# Patient Record
Sex: Male | Born: 1955 | Race: White | Hispanic: No | Marital: Married
Health system: Southern US, Community
[De-identification: ages and names within clinical notes are randomized; demographics above are authoritative.]

## PROBLEM LIST (undated history)

## (undated) ENCOUNTER — Emergency Department (HOSPITAL_COMMUNITY): Payer: Medicare Other

## (undated) DIAGNOSIS — E291 Testicular hypofunction: Secondary | ICD-10-CM

## (undated) DIAGNOSIS — G8929 Other chronic pain: Secondary | ICD-10-CM

## (undated) DIAGNOSIS — I10 Essential (primary) hypertension: Secondary | ICD-10-CM

## (undated) DIAGNOSIS — K219 Gastro-esophageal reflux disease without esophagitis: Secondary | ICD-10-CM

## (undated) DIAGNOSIS — N400 Enlarged prostate without lower urinary tract symptoms: Secondary | ICD-10-CM

## (undated) DIAGNOSIS — M419 Scoliosis, unspecified: Secondary | ICD-10-CM

## (undated) DIAGNOSIS — J309 Allergic rhinitis, unspecified: Secondary | ICD-10-CM

## (undated) DIAGNOSIS — C61 Malignant neoplasm of prostate: Secondary | ICD-10-CM

## (undated) DIAGNOSIS — G4733 Obstructive sleep apnea (adult) (pediatric): Secondary | ICD-10-CM

## (undated) HISTORY — DX: Benign prostatic hyperplasia without lower urinary tract symptoms: N40.0

## (undated) HISTORY — DX: Malignant neoplasm of prostate: C61

## (undated) HISTORY — PX: CATARACT EXTRACTION: SUR2

## (undated) HISTORY — DX: Gastro-esophageal reflux disease without esophagitis: K21.9

## (undated) HISTORY — DX: Testicular hypofunction: E29.1

## (undated) HISTORY — DX: Obstructive sleep apnea (adult) (pediatric): G47.33

## (undated) HISTORY — PX: OTHER SURGICAL HISTORY: SHX169

## (undated) HISTORY — DX: Essential (primary) hypertension: I10

## (undated) HISTORY — DX: Other chronic pain: G89.29

## (undated) HISTORY — DX: Allergic rhinitis, unspecified: J30.9

## (undated) HISTORY — PX: EYE SURGERY: SHX253

## (undated) HISTORY — PX: TONSILLECTOMY: SUR1361

## (undated) HISTORY — PX: ROTATOR CUFF REPAIR: SHX139

## (undated) HISTORY — PX: HERNIA REPAIR: SHX51

## (undated) HISTORY — PX: FOREARM SURGERY: SHX651

---

## 2020-10-15 ENCOUNTER — Other Ambulatory Visit: Payer: Self-pay | Admitting: Internal Medicine

## 2020-10-15 ENCOUNTER — Ambulatory Visit
Admission: RE | Admit: 2020-10-15 | Discharge: 2020-10-15 | Disposition: A | Discharge status: Home / Self Care | Payer: Medicare Other | Source: Ambulatory Visit | Attending: Internal Medicine | Admitting: Internal Medicine

## 2020-10-15 DIAGNOSIS — R109 Unspecified abdominal pain: Secondary | ICD-10-CM

## 2020-10-15 MED ORDER — IOPAMIDOL (ISOVUE-300) INJECTION 61%
100.0000 mL | Freq: Once | INTRAVENOUS | Status: AC | PRN
  Administered 2020-10-15: 100 mL via INTRAVENOUS

## 2021-07-25 DIAGNOSIS — G4730 Sleep apnea, unspecified: Secondary | ICD-10-CM | POA: Diagnosis not present

## 2021-07-25 DIAGNOSIS — M47817 Spondylosis without myelopathy or radiculopathy, lumbosacral region: Secondary | ICD-10-CM | POA: Diagnosis not present

## 2021-07-25 DIAGNOSIS — G4700 Insomnia, unspecified: Secondary | ICD-10-CM | POA: Diagnosis not present

## 2021-07-25 DIAGNOSIS — G8929 Other chronic pain: Secondary | ICD-10-CM | POA: Diagnosis not present

## 2021-08-15 DIAGNOSIS — H35033 Hypertensive retinopathy, bilateral: Secondary | ICD-10-CM | POA: Diagnosis not present

## 2021-08-15 DIAGNOSIS — H53461 Homonymous bilateral field defects, right side: Secondary | ICD-10-CM | POA: Diagnosis not present

## 2021-08-15 DIAGNOSIS — H2513 Age-related nuclear cataract, bilateral: Secondary | ICD-10-CM | POA: Diagnosis not present

## 2021-10-12 ENCOUNTER — Other Ambulatory Visit (HOSPITAL_COMMUNITY): Payer: Self-pay | Admitting: Internal Medicine

## 2021-10-12 ENCOUNTER — Other Ambulatory Visit: Payer: Self-pay | Admitting: Internal Medicine

## 2021-10-12 ENCOUNTER — Ambulatory Visit (HOSPITAL_COMMUNITY)
Admission: RE | Admit: 2021-10-12 | Discharge: 2021-10-12 | Disposition: A | Discharge status: Home / Self Care | Payer: Medicare Other | Source: Ambulatory Visit | Attending: Internal Medicine | Admitting: Internal Medicine

## 2021-10-12 DIAGNOSIS — H53469 Homonymous bilateral field defects, unspecified side: Secondary | ICD-10-CM | POA: Insufficient documentation

## 2021-10-12 DIAGNOSIS — E291 Testicular hypofunction: Secondary | ICD-10-CM | POA: Diagnosis not present

## 2021-10-12 DIAGNOSIS — I10 Essential (primary) hypertension: Secondary | ICD-10-CM | POA: Diagnosis not present

## 2021-10-12 DIAGNOSIS — E785 Hyperlipidemia, unspecified: Secondary | ICD-10-CM | POA: Diagnosis not present

## 2021-10-12 DIAGNOSIS — Z125 Encounter for screening for malignant neoplasm of prostate: Secondary | ICD-10-CM | POA: Diagnosis not present

## 2021-10-12 MED ORDER — GADOBUTROL 1 MMOL/ML IV SOLN
9.0000 mL | Freq: Once | INTRAVENOUS | Status: AC | PRN
  Administered 2021-10-12: 9 mL via INTRAVENOUS

## 2021-10-18 DIAGNOSIS — Z0000 Encounter for general adult medical examination without abnormal findings: Secondary | ICD-10-CM | POA: Diagnosis not present

## 2021-10-18 DIAGNOSIS — H53469 Homonymous bilateral field defects, unspecified side: Secondary | ICD-10-CM | POA: Diagnosis not present

## 2021-10-18 DIAGNOSIS — E785 Hyperlipidemia, unspecified: Secondary | ICD-10-CM | POA: Diagnosis not present

## 2021-10-18 DIAGNOSIS — I700 Atherosclerosis of aorta: Secondary | ICD-10-CM | POA: Diagnosis not present

## 2022-02-15 DIAGNOSIS — M25511 Pain in right shoulder: Secondary | ICD-10-CM | POA: Diagnosis not present

## 2022-02-15 DIAGNOSIS — G894 Chronic pain syndrome: Secondary | ICD-10-CM | POA: Diagnosis not present

## 2022-02-17 DIAGNOSIS — Z79899 Other long term (current) drug therapy: Secondary | ICD-10-CM | POA: Diagnosis not present

## 2022-02-17 DIAGNOSIS — G894 Chronic pain syndrome: Secondary | ICD-10-CM | POA: Diagnosis not present

## 2022-02-17 DIAGNOSIS — M25511 Pain in right shoulder: Secondary | ICD-10-CM | POA: Diagnosis not present

## 2022-02-17 DIAGNOSIS — Z5181 Encounter for therapeutic drug level monitoring: Secondary | ICD-10-CM | POA: Diagnosis not present

## 2022-02-17 DIAGNOSIS — M47817 Spondylosis without myelopathy or radiculopathy, lumbosacral region: Secondary | ICD-10-CM | POA: Diagnosis not present

## 2022-02-24 DIAGNOSIS — M25511 Pain in right shoulder: Secondary | ICD-10-CM | POA: Diagnosis not present

## 2022-03-02 DIAGNOSIS — M25511 Pain in right shoulder: Secondary | ICD-10-CM | POA: Diagnosis not present

## 2022-03-15 DIAGNOSIS — H903 Sensorineural hearing loss, bilateral: Secondary | ICD-10-CM | POA: Diagnosis not present

## 2022-03-21 DIAGNOSIS — M75101 Unspecified rotator cuff tear or rupture of right shoulder, not specified as traumatic: Secondary | ICD-10-CM | POA: Diagnosis not present

## 2022-04-04 DIAGNOSIS — I10 Essential (primary) hypertension: Secondary | ICD-10-CM | POA: Diagnosis not present

## 2022-04-04 DIAGNOSIS — F5104 Psychophysiologic insomnia: Secondary | ICD-10-CM | POA: Diagnosis not present

## 2022-04-04 DIAGNOSIS — I700 Atherosclerosis of aorta: Secondary | ICD-10-CM | POA: Diagnosis not present

## 2022-04-04 DIAGNOSIS — F339 Major depressive disorder, recurrent, unspecified: Secondary | ICD-10-CM | POA: Diagnosis not present

## 2022-04-10 DIAGNOSIS — M7541 Impingement syndrome of right shoulder: Secondary | ICD-10-CM | POA: Diagnosis not present

## 2022-04-10 DIAGNOSIS — M7521 Bicipital tendinitis, right shoulder: Secondary | ICD-10-CM | POA: Diagnosis not present

## 2022-04-10 DIAGNOSIS — M7551 Bursitis of right shoulder: Secondary | ICD-10-CM | POA: Diagnosis not present

## 2022-04-10 DIAGNOSIS — M75121 Complete rotator cuff tear or rupture of right shoulder, not specified as traumatic: Secondary | ICD-10-CM | POA: Diagnosis not present

## 2022-04-10 DIAGNOSIS — S4381XA Sprain of other specified parts of right shoulder girdle, initial encounter: Secondary | ICD-10-CM | POA: Diagnosis not present

## 2022-04-10 DIAGNOSIS — M75111 Incomplete rotator cuff tear or rupture of right shoulder, not specified as traumatic: Secondary | ICD-10-CM | POA: Diagnosis not present

## 2022-04-10 DIAGNOSIS — M948X1 Other specified disorders of cartilage, shoulder: Secondary | ICD-10-CM | POA: Diagnosis not present

## 2022-04-10 DIAGNOSIS — M24111 Other articular cartilage disorders, right shoulder: Secondary | ICD-10-CM | POA: Diagnosis not present

## 2022-04-18 DIAGNOSIS — M25511 Pain in right shoulder: Secondary | ICD-10-CM | POA: Diagnosis not present

## 2022-04-20 DIAGNOSIS — M47817 Spondylosis without myelopathy or radiculopathy, lumbosacral region: Secondary | ICD-10-CM | POA: Diagnosis not present

## 2022-04-20 DIAGNOSIS — M25511 Pain in right shoulder: Secondary | ICD-10-CM | POA: Diagnosis not present

## 2022-04-20 DIAGNOSIS — G894 Chronic pain syndrome: Secondary | ICD-10-CM | POA: Diagnosis not present

## 2022-04-20 DIAGNOSIS — Z79899 Other long term (current) drug therapy: Secondary | ICD-10-CM | POA: Diagnosis not present

## 2022-04-25 DIAGNOSIS — M25511 Pain in right shoulder: Secondary | ICD-10-CM | POA: Diagnosis not present

## 2022-04-27 DIAGNOSIS — M25511 Pain in right shoulder: Secondary | ICD-10-CM | POA: Diagnosis not present

## 2022-05-02 DIAGNOSIS — M25511 Pain in right shoulder: Secondary | ICD-10-CM | POA: Diagnosis not present

## 2022-05-03 DIAGNOSIS — N401 Enlarged prostate with lower urinary tract symptoms: Secondary | ICD-10-CM | POA: Diagnosis not present

## 2022-05-03 DIAGNOSIS — R948 Abnormal results of function studies of other organs and systems: Secondary | ICD-10-CM | POA: Diagnosis not present

## 2022-05-03 DIAGNOSIS — R351 Nocturia: Secondary | ICD-10-CM | POA: Diagnosis not present

## 2022-05-03 DIAGNOSIS — R3912 Poor urinary stream: Secondary | ICD-10-CM | POA: Diagnosis not present

## 2022-05-03 DIAGNOSIS — E291 Testicular hypofunction: Secondary | ICD-10-CM | POA: Diagnosis not present

## 2022-05-09 DIAGNOSIS — M25511 Pain in right shoulder: Secondary | ICD-10-CM | POA: Diagnosis not present

## 2022-05-12 DIAGNOSIS — M25511 Pain in right shoulder: Secondary | ICD-10-CM | POA: Diagnosis not present

## 2022-05-16 DIAGNOSIS — M25511 Pain in right shoulder: Secondary | ICD-10-CM | POA: Diagnosis not present

## 2022-05-18 DIAGNOSIS — M25511 Pain in right shoulder: Secondary | ICD-10-CM | POA: Diagnosis not present

## 2022-05-23 DIAGNOSIS — M25511 Pain in right shoulder: Secondary | ICD-10-CM | POA: Diagnosis not present

## 2022-05-30 DIAGNOSIS — M41122 Adolescent idiopathic scoliosis, cervical region: Secondary | ICD-10-CM | POA: Diagnosis not present

## 2022-05-30 DIAGNOSIS — H259 Unspecified age-related cataract: Secondary | ICD-10-CM | POA: Diagnosis not present

## 2022-05-30 DIAGNOSIS — H47011 Ischemic optic neuropathy, right eye: Secondary | ICD-10-CM | POA: Diagnosis not present

## 2022-05-30 DIAGNOSIS — H5330 Unspecified disorder of binocular vision: Secondary | ICD-10-CM | POA: Diagnosis not present

## 2022-06-01 DIAGNOSIS — F4310 Post-traumatic stress disorder, unspecified: Secondary | ICD-10-CM | POA: Diagnosis not present

## 2022-06-15 DIAGNOSIS — M25511 Pain in right shoulder: Secondary | ICD-10-CM | POA: Diagnosis not present

## 2022-06-15 DIAGNOSIS — F4310 Post-traumatic stress disorder, unspecified: Secondary | ICD-10-CM | POA: Diagnosis not present

## 2022-06-20 DIAGNOSIS — M25511 Pain in right shoulder: Secondary | ICD-10-CM | POA: Diagnosis not present

## 2022-06-22 DIAGNOSIS — M25511 Pain in right shoulder: Secondary | ICD-10-CM | POA: Diagnosis not present

## 2022-06-27 DIAGNOSIS — M25511 Pain in right shoulder: Secondary | ICD-10-CM | POA: Diagnosis not present

## 2022-06-29 DIAGNOSIS — M25511 Pain in right shoulder: Secondary | ICD-10-CM | POA: Diagnosis not present

## 2022-07-06 DIAGNOSIS — M25511 Pain in right shoulder: Secondary | ICD-10-CM | POA: Diagnosis not present

## 2022-07-13 DIAGNOSIS — M25511 Pain in right shoulder: Secondary | ICD-10-CM | POA: Diagnosis not present

## 2022-07-13 DIAGNOSIS — H25813 Combined forms of age-related cataract, bilateral: Secondary | ICD-10-CM | POA: Diagnosis not present

## 2022-07-13 DIAGNOSIS — H524 Presbyopia: Secondary | ICD-10-CM | POA: Diagnosis not present

## 2022-07-13 DIAGNOSIS — H5213 Myopia, bilateral: Secondary | ICD-10-CM | POA: Diagnosis not present

## 2022-07-20 DIAGNOSIS — M25511 Pain in right shoulder: Secondary | ICD-10-CM | POA: Diagnosis not present

## 2022-07-26 DIAGNOSIS — H25011 Cortical age-related cataract, right eye: Secondary | ICD-10-CM | POA: Diagnosis not present

## 2022-07-26 DIAGNOSIS — H25812 Combined forms of age-related cataract, left eye: Secondary | ICD-10-CM | POA: Diagnosis not present

## 2022-07-26 DIAGNOSIS — H2512 Age-related nuclear cataract, left eye: Secondary | ICD-10-CM | POA: Diagnosis not present

## 2022-07-26 DIAGNOSIS — H2511 Age-related nuclear cataract, right eye: Secondary | ICD-10-CM | POA: Diagnosis not present

## 2022-07-27 DIAGNOSIS — G894 Chronic pain syndrome: Secondary | ICD-10-CM | POA: Diagnosis not present

## 2022-07-27 DIAGNOSIS — M47817 Spondylosis without myelopathy or radiculopathy, lumbosacral region: Secondary | ICD-10-CM | POA: Diagnosis not present

## 2022-07-27 DIAGNOSIS — M25511 Pain in right shoulder: Secondary | ICD-10-CM | POA: Diagnosis not present

## 2022-07-27 DIAGNOSIS — Z79899 Other long term (current) drug therapy: Secondary | ICD-10-CM | POA: Diagnosis not present

## 2022-08-02 DIAGNOSIS — H2511 Age-related nuclear cataract, right eye: Secondary | ICD-10-CM | POA: Diagnosis not present

## 2022-08-02 DIAGNOSIS — H2181 Floppy iris syndrome: Secondary | ICD-10-CM | POA: Diagnosis not present

## 2022-08-08 DIAGNOSIS — M25511 Pain in right shoulder: Secondary | ICD-10-CM | POA: Diagnosis not present

## 2022-08-10 DIAGNOSIS — M25511 Pain in right shoulder: Secondary | ICD-10-CM | POA: Diagnosis not present

## 2022-08-15 DIAGNOSIS — Z9889 Other specified postprocedural states: Secondary | ICD-10-CM | POA: Diagnosis not present

## 2022-10-24 DIAGNOSIS — R948 Abnormal results of function studies of other organs and systems: Secondary | ICD-10-CM | POA: Diagnosis not present

## 2022-10-24 DIAGNOSIS — E291 Testicular hypofunction: Secondary | ICD-10-CM | POA: Diagnosis not present

## 2022-10-27 DIAGNOSIS — E291 Testicular hypofunction: Secondary | ICD-10-CM | POA: Diagnosis not present

## 2022-10-27 DIAGNOSIS — R972 Elevated prostate specific antigen [PSA]: Secondary | ICD-10-CM | POA: Diagnosis not present

## 2022-10-27 DIAGNOSIS — N401 Enlarged prostate with lower urinary tract symptoms: Secondary | ICD-10-CM | POA: Diagnosis not present

## 2022-10-27 DIAGNOSIS — R351 Nocturia: Secondary | ICD-10-CM | POA: Diagnosis not present

## 2022-11-09 DIAGNOSIS — M47817 Spondylosis without myelopathy or radiculopathy, lumbosacral region: Secondary | ICD-10-CM | POA: Diagnosis not present

## 2022-11-09 DIAGNOSIS — G894 Chronic pain syndrome: Secondary | ICD-10-CM | POA: Diagnosis not present

## 2022-11-09 DIAGNOSIS — Z79899 Other long term (current) drug therapy: Secondary | ICD-10-CM | POA: Diagnosis not present

## 2022-11-09 DIAGNOSIS — M25511 Pain in right shoulder: Secondary | ICD-10-CM | POA: Diagnosis not present

## 2022-11-22 DIAGNOSIS — K080 Exfoliation of teeth due to systemic causes: Secondary | ICD-10-CM | POA: Diagnosis not present

## 2022-12-29 DIAGNOSIS — M47817 Spondylosis without myelopathy or radiculopathy, lumbosacral region: Secondary | ICD-10-CM | POA: Diagnosis not present

## 2023-01-17 ENCOUNTER — Ambulatory Visit (HOSPITAL_COMMUNITY)
Admission: EM | Admit: 2023-01-17 | Discharge: 2023-01-17 | Disposition: A | Discharge status: Home / Self Care | Payer: Medicare Other | Attending: Family Medicine | Admitting: Family Medicine

## 2023-01-17 ENCOUNTER — Ambulatory Visit (INDEPENDENT_AMBULATORY_CARE_PROVIDER_SITE_OTHER): Payer: Medicare Other

## 2023-01-17 ENCOUNTER — Encounter (HOSPITAL_COMMUNITY): Payer: Self-pay | Admitting: Emergency Medicine

## 2023-01-17 DIAGNOSIS — Z23 Encounter for immunization: Secondary | ICD-10-CM | POA: Diagnosis not present

## 2023-01-17 DIAGNOSIS — S61011A Laceration without foreign body of right thumb without damage to nail, initial encounter: Secondary | ICD-10-CM

## 2023-01-17 HISTORY — DX: Scoliosis, unspecified: M41.9

## 2023-01-17 MED ORDER — TETANUS-DIPHTH-ACELL PERTUSSIS 5-2.5-18.5 LF-MCG/0.5 IM SUSY
PREFILLED_SYRINGE | INTRAMUSCULAR | Status: AC
  Filled 2023-01-17: qty 0.5

## 2023-01-17 MED ORDER — TETANUS-DIPHTH-ACELL PERTUSSIS 5-2.5-18.5 LF-MCG/0.5 IM SUSY
0.5000 mL | PREFILLED_SYRINGE | Freq: Once | INTRAMUSCULAR | Status: AC
  Administered 2023-01-17: 0.5 mL via INTRAMUSCULAR

## 2023-01-17 NOTE — Discharge Instructions (Signed)
Meds ordered this encounter  Medications   Tdap (BOOSTRIX) injection 0.5 mL    

## 2023-01-17 NOTE — ED Triage Notes (Signed)
Pt cut tip of right thumb with table saw. Bleeding welled controled during triage. Unknown last tetanus shot.

## 2023-01-18 NOTE — ED Provider Notes (Signed)
  [facility]   [id number] 01/17/23 Arrival Time: 1625  ASSESSMENT & PLAN:  1. Laceration of right thumb without foreign body without damage to nail, initial encounter    Meds ordered this encounter  Medications   Tdap (BOOSTRIX) injection 0.5 mL   I have personally viewed and independently interpreted the imaging studies ordered this visit. No fracture of R thumb appreciated. No radiopaque FB.  Wound cleaned. Steri-strips applied to hold edges of wound together. Declines sutures. Should heal well. Neuro/vasc intact.  Reviewed expectations re: course of current medical issues. Questions answered. Outlined signs and symptoms indicating need for more acute intervention. Patient verbalized understanding. After Visit Summary given.   SUBJECTIVE:  Benjamin Elliott is a 67 y.o. male who presents with a laceration of R thumb; cut with table saw; today. Bleeding is mild. Denies sensation changes.  Td UTD: Yes.   OBJECTIVE:  Vitals:   01/17/23 1711 01/17/23 1724  BP: 138/81   Pulse: 63   Resp: 17   Temp:  98.9 F (37.2 C)  TempSrc:  Oral  SpO2: 96%      General appearance: alert; no distress Skin: linear laceration of tip of R thumb without nail involvement; size: approx 0.5 cm; rough wound edges, no foreign bodies; without active bleeding Psychological: alert and cooperative; normal mood and affect  DG Finger Thumb Right  Result Date: 01/17/2023 CLINICAL DATA:  Table saw injury. EXAM: RIGHT THUMB 2+V COMPARISON:  None Available. FINDINGS: Three-view exam shows soft tissue defect in the tip of the thumb compatible laceration. No underlying fracture of the distal phalanx. No cortical injury noted in the distal phalanx. No evidence for retained radiopaque soft tissue foreign body. IMPRESSION: Soft tissue laceration without underlying fracture or retained radiopaque soft tissue foreign body. Electronically Signed   By: Kennith Center M.D.   On: 01/17/2023 18:36    No Known  Allergies  Past Medical History:  Diagnosis Date   Scoliosis    Social History   Socioeconomic History   Marital status: Married    Spouse name: Not on file   Number of children: Not on file   Years of education: Not on file   Highest education level: Not on file  Occupational History   Not on file  Tobacco Use   Smoking status: Not on file   Smokeless tobacco: Not on file  Substance and Sexual Activity   Alcohol use: Not on file   Drug use: Not on file   Sexual activity: Not on file  Other Topics Concern   Not on file  Social History Narrative   Not on file   Social Determinants of Health   Financial Resource Strain: Not on file  Food Insecurity: Not on file  Transportation Needs: Not on file  Physical Activity: Not on file  Stress: Not on file  Social Connections: Not on file          Mardella Layman, MD 01/18/23 [phone]

## 2023-01-19 DIAGNOSIS — E291 Testicular hypofunction: Secondary | ICD-10-CM | POA: Diagnosis not present

## 2023-01-19 DIAGNOSIS — E785 Hyperlipidemia, unspecified: Secondary | ICD-10-CM | POA: Diagnosis not present

## 2023-01-19 DIAGNOSIS — N400 Enlarged prostate without lower urinary tract symptoms: Secondary | ICD-10-CM | POA: Diagnosis not present

## 2023-01-26 DIAGNOSIS — Z0000 Encounter for general adult medical examination without abnormal findings: Secondary | ICD-10-CM | POA: Diagnosis not present

## 2023-01-26 DIAGNOSIS — Z1331 Encounter for screening for depression: Secondary | ICD-10-CM | POA: Diagnosis not present

## 2023-01-26 DIAGNOSIS — Z1339 Encounter for screening examination for other mental health and behavioral disorders: Secondary | ICD-10-CM | POA: Diagnosis not present

## 2023-01-26 DIAGNOSIS — F339 Major depressive disorder, recurrent, unspecified: Secondary | ICD-10-CM | POA: Diagnosis not present

## 2023-02-15 DIAGNOSIS — Z79891 Long term (current) use of opiate analgesic: Secondary | ICD-10-CM | POA: Diagnosis not present

## 2023-02-15 DIAGNOSIS — M47817 Spondylosis without myelopathy or radiculopathy, lumbosacral region: Secondary | ICD-10-CM | POA: Diagnosis not present

## 2023-02-26 DIAGNOSIS — R972 Elevated prostate specific antigen [PSA]: Secondary | ICD-10-CM | POA: Diagnosis not present

## 2023-02-26 DIAGNOSIS — E291 Testicular hypofunction: Secondary | ICD-10-CM | POA: Diagnosis not present

## 2023-03-06 DIAGNOSIS — R351 Nocturia: Secondary | ICD-10-CM | POA: Diagnosis not present

## 2023-03-06 DIAGNOSIS — N401 Enlarged prostate with lower urinary tract symptoms: Secondary | ICD-10-CM | POA: Diagnosis not present

## 2023-03-06 DIAGNOSIS — R972 Elevated prostate specific antigen [PSA]: Secondary | ICD-10-CM | POA: Diagnosis not present

## 2023-03-06 DIAGNOSIS — E291 Testicular hypofunction: Secondary | ICD-10-CM | POA: Diagnosis not present

## 2023-03-07 ENCOUNTER — Other Ambulatory Visit: Payer: Self-pay | Admitting: Urology

## 2023-03-07 DIAGNOSIS — R972 Elevated prostate specific antigen [PSA]: Secondary | ICD-10-CM

## 2023-03-09 DIAGNOSIS — Z4889 Encounter for other specified surgical aftercare: Secondary | ICD-10-CM | POA: Diagnosis not present

## 2023-04-03 ENCOUNTER — Ambulatory Visit (HOSPITAL_COMMUNITY): Admission: RE | Admit: 2023-04-03 | Payer: Medicare Other | Source: Ambulatory Visit

## 2023-04-18 ENCOUNTER — Ambulatory Visit (HOSPITAL_COMMUNITY)
Admission: RE | Admit: 2023-04-18 | Discharge: 2023-04-18 | Disposition: A | Discharge status: Home / Self Care | Payer: Medicare Other | Source: Ambulatory Visit | Attending: Urology | Admitting: Urology

## 2023-04-18 DIAGNOSIS — N4289 Other specified disorders of prostate: Secondary | ICD-10-CM | POA: Diagnosis not present

## 2023-04-18 DIAGNOSIS — R972 Elevated prostate specific antigen [PSA]: Secondary | ICD-10-CM | POA: Insufficient documentation

## 2023-04-18 DIAGNOSIS — N323 Diverticulum of bladder: Secondary | ICD-10-CM | POA: Diagnosis not present

## 2023-04-18 DIAGNOSIS — N400 Enlarged prostate without lower urinary tract symptoms: Secondary | ICD-10-CM | POA: Diagnosis not present

## 2023-04-18 MED ORDER — GADOBUTROL 1 MMOL/ML IV SOLN
7.0000 mL | Freq: Once | INTRAVENOUS | Status: AC | PRN
  Administered 2023-04-18: 7 mL via INTRAVENOUS

## 2023-04-27 DIAGNOSIS — C61 Malignant neoplasm of prostate: Secondary | ICD-10-CM | POA: Diagnosis not present

## 2023-04-27 DIAGNOSIS — R972 Elevated prostate specific antigen [PSA]: Secondary | ICD-10-CM | POA: Diagnosis not present

## 2023-05-05 ENCOUNTER — Other Ambulatory Visit: Payer: Medicare Other

## 2023-05-10 DIAGNOSIS — C61 Malignant neoplasm of prostate: Secondary | ICD-10-CM | POA: Diagnosis not present

## 2023-05-10 DIAGNOSIS — E291 Testicular hypofunction: Secondary | ICD-10-CM | POA: Diagnosis not present

## 2023-05-10 DIAGNOSIS — R3912 Poor urinary stream: Secondary | ICD-10-CM | POA: Diagnosis not present

## 2023-05-10 DIAGNOSIS — R350 Frequency of micturition: Secondary | ICD-10-CM | POA: Diagnosis not present

## 2023-05-16 DIAGNOSIS — H5203 Hypermetropia, bilateral: Secondary | ICD-10-CM | POA: Diagnosis not present

## 2023-05-17 DIAGNOSIS — M47817 Spondylosis without myelopathy or radiculopathy, lumbosacral region: Secondary | ICD-10-CM | POA: Diagnosis not present

## 2023-05-17 DIAGNOSIS — M545 Low back pain, unspecified: Secondary | ICD-10-CM | POA: Diagnosis not present

## 2023-05-17 DIAGNOSIS — M25511 Pain in right shoulder: Secondary | ICD-10-CM | POA: Diagnosis not present

## 2023-05-25 DIAGNOSIS — Z86711 Personal history of pulmonary embolism: Secondary | ICD-10-CM | POA: Diagnosis not present

## 2023-05-25 DIAGNOSIS — M47817 Spondylosis without myelopathy or radiculopathy, lumbosacral region: Secondary | ICD-10-CM | POA: Diagnosis not present

## 2023-05-25 DIAGNOSIS — Z86718 Personal history of other venous thrombosis and embolism: Secondary | ICD-10-CM | POA: Diagnosis not present

## 2023-05-25 DIAGNOSIS — M5416 Radiculopathy, lumbar region: Secondary | ICD-10-CM | POA: Diagnosis not present

## 2023-05-25 DIAGNOSIS — M791 Myalgia, unspecified site: Secondary | ICD-10-CM | POA: Diagnosis not present

## 2023-05-25 DIAGNOSIS — M419 Scoliosis, unspecified: Secondary | ICD-10-CM | POA: Diagnosis not present

## 2023-05-25 DIAGNOSIS — G894 Chronic pain syndrome: Secondary | ICD-10-CM | POA: Diagnosis not present

## 2023-06-02 DIAGNOSIS — C61 Malignant neoplasm of prostate: Secondary | ICD-10-CM | POA: Diagnosis not present

## 2023-06-18 DIAGNOSIS — R350 Frequency of micturition: Secondary | ICD-10-CM | POA: Diagnosis not present

## 2023-08-10 DIAGNOSIS — Z23 Encounter for immunization: Secondary | ICD-10-CM | POA: Diagnosis not present

## 2023-08-10 DIAGNOSIS — C61 Malignant neoplasm of prostate: Secondary | ICD-10-CM | POA: Diagnosis not present

## 2023-08-10 DIAGNOSIS — I10 Essential (primary) hypertension: Secondary | ICD-10-CM | POA: Diagnosis not present

## 2023-08-20 DIAGNOSIS — R350 Frequency of micturition: Secondary | ICD-10-CM | POA: Diagnosis not present

## 2023-08-20 DIAGNOSIS — C61 Malignant neoplasm of prostate: Secondary | ICD-10-CM | POA: Diagnosis not present

## 2023-08-20 DIAGNOSIS — E291 Testicular hypofunction: Secondary | ICD-10-CM | POA: Diagnosis not present

## 2023-08-20 DIAGNOSIS — N401 Enlarged prostate with lower urinary tract symptoms: Secondary | ICD-10-CM | POA: Diagnosis not present

## 2023-09-20 DIAGNOSIS — M25511 Pain in right shoulder: Secondary | ICD-10-CM | POA: Diagnosis not present

## 2023-09-20 DIAGNOSIS — M47817 Spondylosis without myelopathy or radiculopathy, lumbosacral region: Secondary | ICD-10-CM | POA: Diagnosis not present

## 2023-09-20 DIAGNOSIS — G8929 Other chronic pain: Secondary | ICD-10-CM | POA: Diagnosis not present

## 2023-09-20 DIAGNOSIS — Z79899 Other long term (current) drug therapy: Secondary | ICD-10-CM | POA: Diagnosis not present

## 2023-10-16 DIAGNOSIS — E291 Testicular hypofunction: Secondary | ICD-10-CM | POA: Diagnosis not present

## 2023-10-16 DIAGNOSIS — C61 Malignant neoplasm of prostate: Secondary | ICD-10-CM | POA: Diagnosis not present

## 2023-10-23 DIAGNOSIS — E291 Testicular hypofunction: Secondary | ICD-10-CM | POA: Diagnosis not present

## 2023-10-23 DIAGNOSIS — C61 Malignant neoplasm of prostate: Secondary | ICD-10-CM | POA: Diagnosis not present

## 2023-10-29 IMAGING — MR MR HEAD WO/W CM
15 series · 48 of 48 positions shown · IV contrast (gadavist)
Comparison: None.

CLINICAL DATA: Homonymous bilateral field cuts.

EXAM:
MRI HEAD WITHOUT AND WITH CONTRAST
TECHNIQUE: Multiplanar, multiecho pulse sequences of the brain and surrounding
structures were obtained without and with intravenous contrast.
CONTRAST:  9mL GADAVIST GADOBUTROL 1 MMOL/ML IV SOLN

[Series 5: DWI · axial · 3.0mm · 1.36mm/px · z∈[-50,+91]mm · 4 of 96 slices shown (1 of 2)]
[im 1/96]
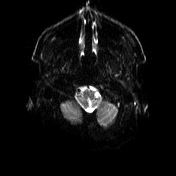
[im 32/96]
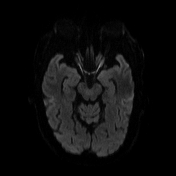
[im 64/96]
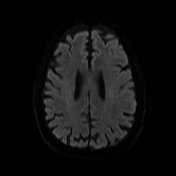
[im 96/96]
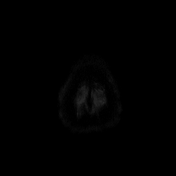

[Series 6: DWI · axial · 3.0mm · 1.36mm/px · z∈[-50,+91]mm · 3 of 48 slices shown (2 of 2)]
[im 1/48]
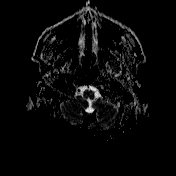
[im 24/48]
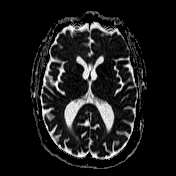
[im 48/48]
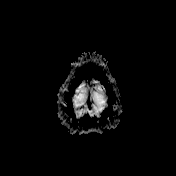

[Series 7: T1 · sagittal · 5.0mm · 0.75mm/px · 1 of 24 slices shown (1 of 4)]
[im 1/24]
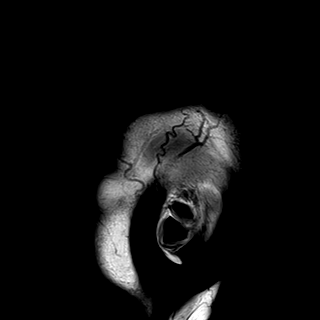

[Series 8: T2 · axial · 5.0mm · 0.62mm/px · 1 of 26 slices shown]
[im 1/26]
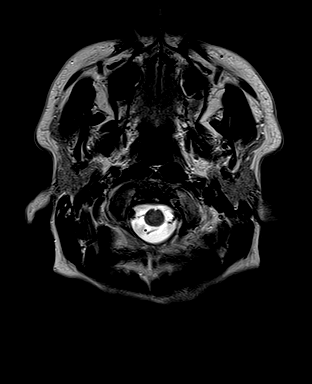

[Series 9: swi_images · axial · 3.0mm · 0.75mm/px · z∈[-61,+104]mm · 3 of 56 slices shown]
[im 1/56]
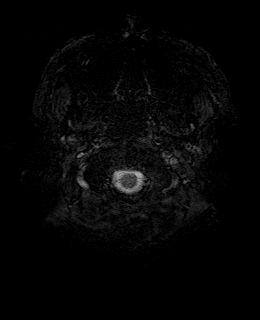
[im 28/56]
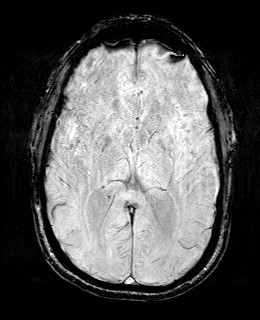
[im 56/56]
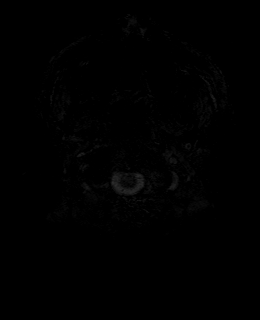

[Series 11: FLAIR · axial · 3.0mm · 0.75mm/px · z∈[-55,+98]mm · 3 of 52 slices shown]
[im 1/52]
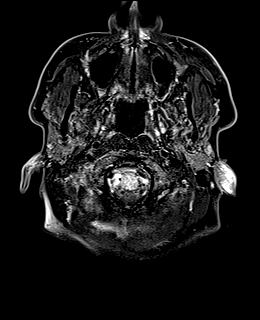
[im 26/52]
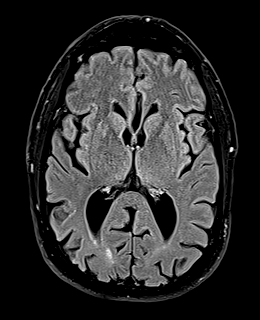
[im 52/52]
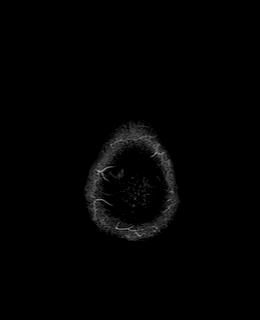

[Series 12: T1 · axial · 1.0mm · 0.94mm/px · z∈[-58,+101]mm · 9 of 160 slices shown (2 of 4)]
[im 1/160]
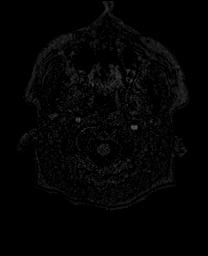
[im 20/160]
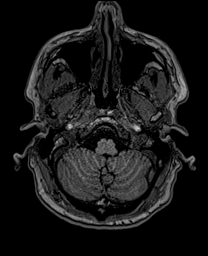
[im 40/160]
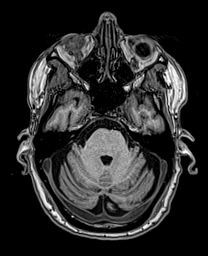
[im 60/160]
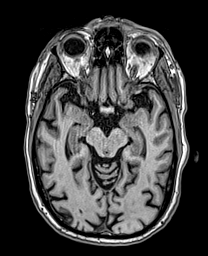
[im 80/160]
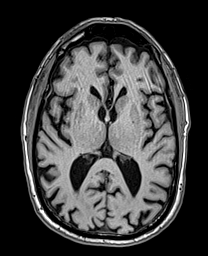
[im 100/160]
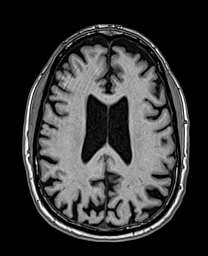
[im 120/160]
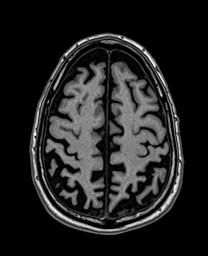
[im 140/160]
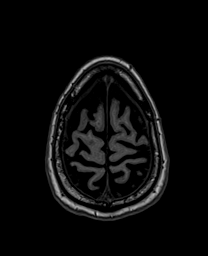
[im 160/160]
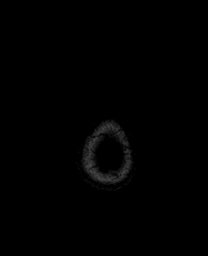

[Series 13: cor dwi_tracew · coronal · 5.0mm · 1.53mm/px · 3 of 56 slices shown]
[im 1/56]
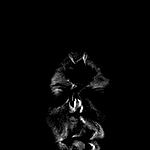
[im 28/56]
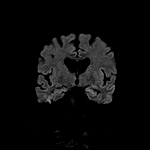
[im 56/56]
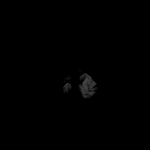

[Series 14: cor dwi_adc · coronal · 5.0mm · 1.53mm/px · 2 of 28 slices shown]
[im 1/28]
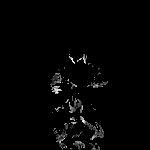
[im 28/28]
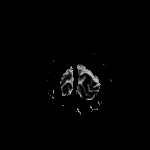

[Series 15: T2 post-contrast · coronal · 5.0mm · 0.57mm/px · 2 of 28 slices shown]
[im 1/28]
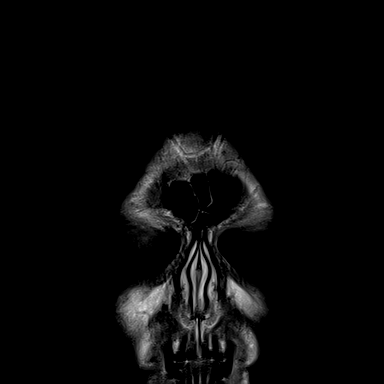
[im 28/28]
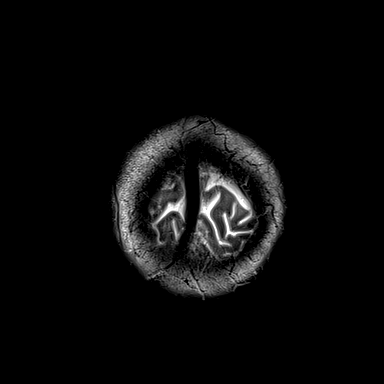

[Series 16: T1 post-contrast · axial · 1.0mm · 0.94mm/px · z∈[-58,+101]mm · 9 of 160 slices shown (1 of 3)]
[im 1/160]
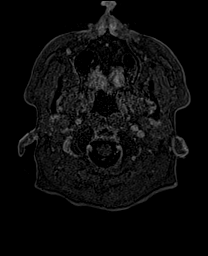
[im 20/160]
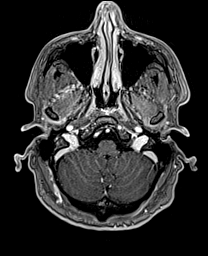
[im 40/160]
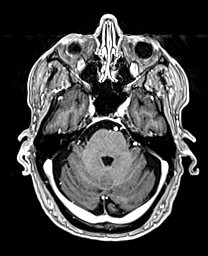
[im 60/160]
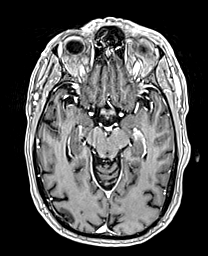
[im 80/160]
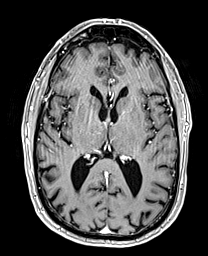
[im 100/160]
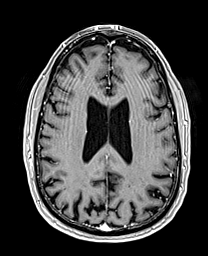
[im 120/160]
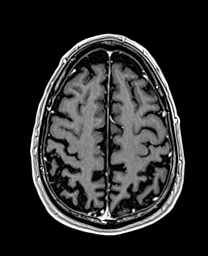
[im 140/160]
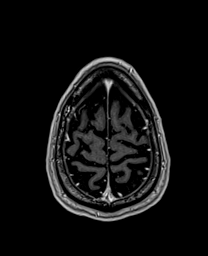
[im 160/160]
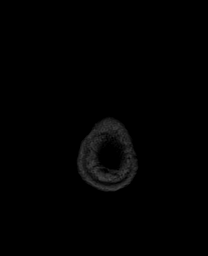

[Series 17: T1 · sagittal · 4.0mm · 0.94mm/px · 2 of 35 slices shown (3 of 4)]
[im 1/35]
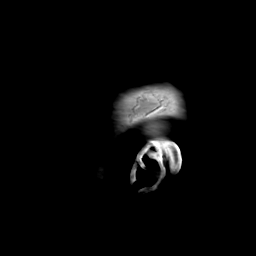
[im 35/35]
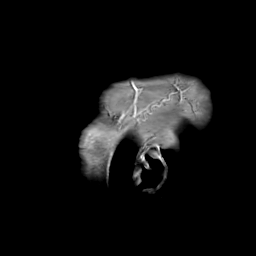

[Series 18: T1 · coronal · 4.0mm · 0.94mm/px · 3 of 49 slices shown (4 of 4)]
[im 1/49]
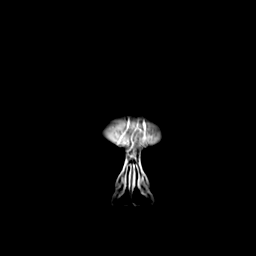
[im 25/49]
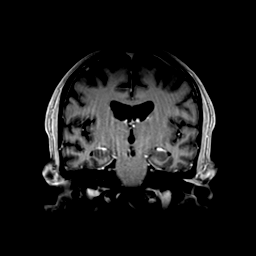
[im 49/49]
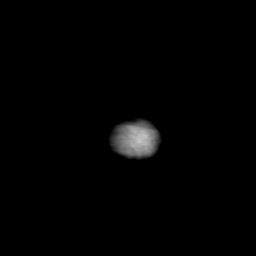

[Series 19: T1 post-contrast · coronal · 5.0mm · 0.43mm/px · 2 of 28 slices shown (2 of 3)]
[im 1/28]
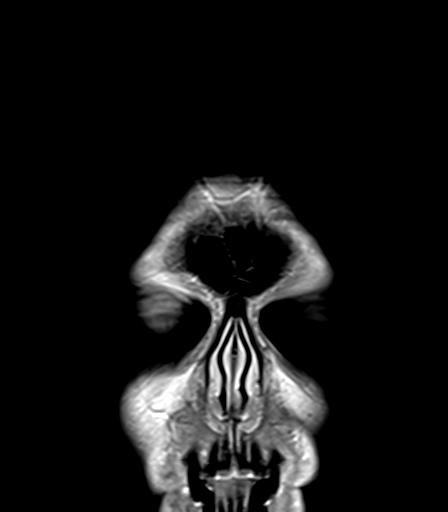
[im 28/28]
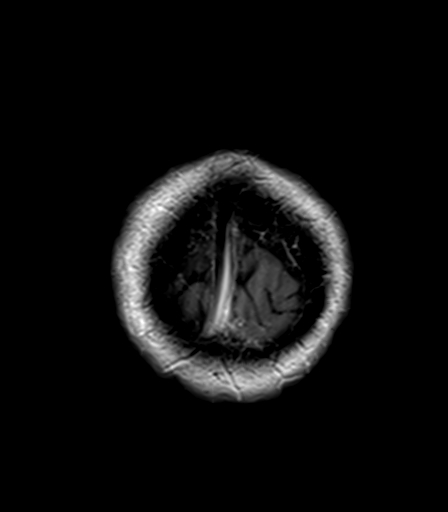

[Series 20: T1 post-contrast · sagittal · 5.0mm · 0.75mm/px · 1 of 24 slices shown (3 of 3)]
[im 1/24]
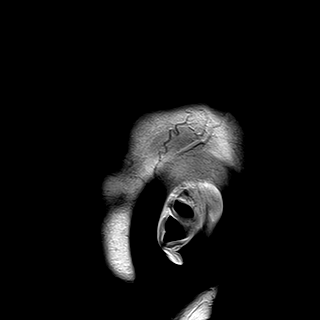

[48 of 48 positions shown; findings below may reference images not displayed]

FINDINGS: Brain: Diffusion imaging does not show any acute or subacute
infarction. There is generalized brain volume loss without lobar
predominance. No focal abnormality affects the brainstem or
cerebellum. No pituitary or sellar mass. Cerebral hemispheres do not
show any old small or large vessel infarction. No mass lesion,
hemorrhage, hydrocephalus or extra-axial collection. After contrast
administration, no abnormal enhancement occurs.

Vascular: Major vessels at the base of the brain show flow.

Skull and upper cervical spine: Negative

Sinuses/Orbits: Clear sinuses.  Orbits appear normal.

Other: None
IMPRESSION: No cause of the presenting symptoms is identified. No evidence of
stroke, mass or visible orbital pathology.

Generalized brain volume loss without lobar predominance.

## 2023-11-28 DIAGNOSIS — L821 Other seborrheic keratosis: Secondary | ICD-10-CM | POA: Diagnosis not present

## 2023-11-28 DIAGNOSIS — L570 Actinic keratosis: Secondary | ICD-10-CM | POA: Diagnosis not present

## 2023-11-28 DIAGNOSIS — D485 Neoplasm of uncertain behavior of skin: Secondary | ICD-10-CM | POA: Diagnosis not present

## 2023-11-28 DIAGNOSIS — L814 Other melanin hyperpigmentation: Secondary | ICD-10-CM | POA: Diagnosis not present

## 2023-12-27 DIAGNOSIS — M25511 Pain in right shoulder: Secondary | ICD-10-CM | POA: Diagnosis not present

## 2023-12-27 DIAGNOSIS — M47817 Spondylosis without myelopathy or radiculopathy, lumbosacral region: Secondary | ICD-10-CM | POA: Diagnosis not present

## 2023-12-27 DIAGNOSIS — Z79899 Other long term (current) drug therapy: Secondary | ICD-10-CM | POA: Diagnosis not present

## 2023-12-27 DIAGNOSIS — G8929 Other chronic pain: Secondary | ICD-10-CM | POA: Diagnosis not present

## 2024-01-29 DIAGNOSIS — H903 Sensorineural hearing loss, bilateral: Secondary | ICD-10-CM | POA: Diagnosis not present

## 2024-02-06 DIAGNOSIS — Z961 Presence of intraocular lens: Secondary | ICD-10-CM | POA: Diagnosis not present

## 2024-02-06 DIAGNOSIS — I10 Essential (primary) hypertension: Secondary | ICD-10-CM | POA: Diagnosis not present

## 2024-02-06 DIAGNOSIS — H0102A Squamous blepharitis right eye, upper and lower eyelids: Secondary | ICD-10-CM | POA: Diagnosis not present

## 2024-02-06 DIAGNOSIS — H26491 Other secondary cataract, right eye: Secondary | ICD-10-CM | POA: Diagnosis not present

## 2024-02-06 DIAGNOSIS — H26493 Other secondary cataract, bilateral: Secondary | ICD-10-CM | POA: Diagnosis not present

## 2024-02-08 DIAGNOSIS — E291 Testicular hypofunction: Secondary | ICD-10-CM | POA: Diagnosis not present

## 2024-02-08 DIAGNOSIS — C61 Malignant neoplasm of prostate: Secondary | ICD-10-CM | POA: Diagnosis not present

## 2024-02-08 DIAGNOSIS — E7849 Other hyperlipidemia: Secondary | ICD-10-CM | POA: Diagnosis not present

## 2024-02-08 DIAGNOSIS — E785 Hyperlipidemia, unspecified: Secondary | ICD-10-CM | POA: Diagnosis not present

## 2024-02-11 DIAGNOSIS — H26492 Other secondary cataract, left eye: Secondary | ICD-10-CM | POA: Diagnosis not present

## 2024-02-15 DIAGNOSIS — C61 Malignant neoplasm of prostate: Secondary | ICD-10-CM | POA: Diagnosis not present

## 2024-02-15 DIAGNOSIS — Z0000 Encounter for general adult medical examination without abnormal findings: Secondary | ICD-10-CM | POA: Diagnosis not present

## 2024-02-15 DIAGNOSIS — Z1331 Encounter for screening for depression: Secondary | ICD-10-CM | POA: Diagnosis not present

## 2024-02-15 DIAGNOSIS — Z1339 Encounter for screening examination for other mental health and behavioral disorders: Secondary | ICD-10-CM | POA: Diagnosis not present

## 2024-02-25 ENCOUNTER — Other Ambulatory Visit: Payer: Self-pay | Admitting: Urology

## 2024-02-25 DIAGNOSIS — C61 Malignant neoplasm of prostate: Secondary | ICD-10-CM

## 2024-03-12 ENCOUNTER — Encounter: Payer: Self-pay | Admitting: Urology

## 2024-03-20 DIAGNOSIS — G8929 Other chronic pain: Secondary | ICD-10-CM | POA: Diagnosis not present

## 2024-03-20 DIAGNOSIS — M25511 Pain in right shoulder: Secondary | ICD-10-CM | POA: Diagnosis not present

## 2024-03-20 DIAGNOSIS — Z79899 Other long term (current) drug therapy: Secondary | ICD-10-CM | POA: Diagnosis not present

## 2024-03-20 DIAGNOSIS — M47817 Spondylosis without myelopathy or radiculopathy, lumbosacral region: Secondary | ICD-10-CM | POA: Diagnosis not present

## 2024-04-07 ENCOUNTER — Encounter: Payer: Self-pay | Admitting: *Deleted

## 2024-04-07 ENCOUNTER — Telehealth: Payer: Self-pay | Admitting: Neurology

## 2024-04-07 ENCOUNTER — Institutional Professional Consult (permissible substitution): Payer: Self-pay | Admitting: Neurology

## 2024-04-07 ENCOUNTER — Encounter: Payer: Self-pay | Admitting: Adult Health

## 2024-04-07 NOTE — Telephone Encounter (Signed)
 NS for sleep consult.

## 2024-05-08 DIAGNOSIS — R413 Other amnesia: Secondary | ICD-10-CM | POA: Diagnosis not present

## 2024-05-09 ENCOUNTER — Other Ambulatory Visit: Payer: Self-pay | Admitting: Adult Health

## 2024-05-09 ENCOUNTER — Ambulatory Visit (HOSPITAL_COMMUNITY)
Admission: RE | Admit: 2024-05-09 | Discharge: 2024-05-09 | Disposition: A | Discharge status: Home / Self Care | Source: Ambulatory Visit | Attending: Urology | Admitting: Urology

## 2024-05-09 DIAGNOSIS — R413 Other amnesia: Secondary | ICD-10-CM

## 2024-05-09 DIAGNOSIS — C61 Malignant neoplasm of prostate: Secondary | ICD-10-CM

## 2024-05-09 DIAGNOSIS — H539 Unspecified visual disturbance: Secondary | ICD-10-CM

## 2024-05-09 DIAGNOSIS — R410 Disorientation, unspecified: Secondary | ICD-10-CM

## 2024-05-12 ENCOUNTER — Ambulatory Visit (HOSPITAL_COMMUNITY)
Admission: RE | Admit: 2024-05-12 | Discharge: 2024-05-12 | Disposition: A | Discharge status: Home / Self Care | Source: Ambulatory Visit | Attending: Urology | Admitting: Urology

## 2024-05-12 DIAGNOSIS — C61 Malignant neoplasm of prostate: Secondary | ICD-10-CM | POA: Diagnosis not present

## 2024-05-12 MED ORDER — GADOBUTROL 1 MMOL/ML IV SOLN
10.0000 mL | Freq: Once | INTRAVENOUS | Status: AC | PRN
  Administered 2024-05-12: 10 mL via INTRAVENOUS

## 2024-05-26 ENCOUNTER — Ambulatory Visit (HOSPITAL_COMMUNITY)
Admission: RE | Admit: 2024-05-26 | Discharge: 2024-05-26 | Disposition: A | Discharge status: Home / Self Care | Source: Ambulatory Visit | Attending: Adult Health | Admitting: Adult Health

## 2024-05-26 DIAGNOSIS — H539 Unspecified visual disturbance: Secondary | ICD-10-CM | POA: Insufficient documentation

## 2024-05-26 DIAGNOSIS — R413 Other amnesia: Secondary | ICD-10-CM | POA: Diagnosis not present

## 2024-05-26 DIAGNOSIS — R410 Disorientation, unspecified: Secondary | ICD-10-CM | POA: Diagnosis not present

## 2024-05-26 MED ORDER — GADOBUTROL 1 MMOL/ML IV SOLN
7.5000 mL | Freq: Once | INTRAVENOUS | Status: AC | PRN
  Administered 2024-05-26: 7.5 mL via INTRAVENOUS

## 2024-05-29 ENCOUNTER — Ambulatory Visit: Admitting: Neurology

## 2024-05-29 ENCOUNTER — Encounter: Payer: Self-pay | Admitting: Neurology

## 2024-05-29 VITALS — BP 124/78 | HR 60 | Ht 72.0 in | Wt 178.3 lb

## 2024-05-29 DIAGNOSIS — G4731 Primary central sleep apnea: Secondary | ICD-10-CM

## 2024-05-29 DIAGNOSIS — R351 Nocturia: Secondary | ICD-10-CM

## 2024-05-29 DIAGNOSIS — G4733 Obstructive sleep apnea (adult) (pediatric): Secondary | ICD-10-CM | POA: Diagnosis not present

## 2024-05-29 DIAGNOSIS — Z79899 Other long term (current) drug therapy: Secondary | ICD-10-CM | POA: Diagnosis not present

## 2024-05-29 NOTE — Progress Notes (Signed)
 Subjective:    Patient ID: Benjamin Elliott is a 68 y.o. male.  HPI    True Mar, MD, PhD Benjamin Elliott [address] [address] [place], [place] [address]  Dear Dr. Stephane,   I saw your patient, Benjamin Elliott, upon your kind request in my sleep clinic today for initial consultation of his sleep disorder, in particular, evaluation of his prior diagnosis of obstructive sleep apnea.  The patient is accompanied by his wife today.  As you know, Benjamin Elliott is a 68 year old male with an underlying complex medical history of BPH, prostate cancer, history of DVT and PE, status post Greenfield filter, status post spinal cord stimulator, hearing loss with status post bilateral hearing aids, allergic rhinitis, reflux disease, hypogonadism, scoliosis, chronic pain, on chronic narcotic pain medication, hypertension, hyperlipidemia, and depression, who was previously diagnosed with obstructive sleep apnea and placed on PAP therapy.  He reports that he had a total of 3 sleep studies.  He was on a BiPAP and then a CPAP per wife.  She supplements his history.  He has had cognitive complaints.  He is followed by pain management.  They moved from [place] in 2021.  [place] the [place].  Prior sleep study results are not available for my review today.  His Epworth sleepiness score is.  I reviewed your office note from 08/10/2023.  He had blood work in July 2024.  His CMP was benign at the time, lipid panel showed borderline total cholesterol of 209, HDL 67, LDL 124.  He was previously followed by [facility] and I saw and records from 2014 that he was on BiPAP therapy.  He has not used a PAP machine in at least 2 years as he lost his machine.  It looks like he had a titration study in April 2014 and was placed on BiPAP ST.  He reportedly had severe obstructive sleep apnea.  He saw Dr. Prentice Elliott at [facility] in 2014.   His wife reports that he was  started on Provigil many years ago when he was on much higher dose medications.  He is now status post spinal cord stimulator.  He is followed by [facility] pain management.  He is a non-smoker.  He drinks caffeine in the form of coffee, about 2 cups/day.  He does not sleep well.  Bedtime is around 9 PM and rise time between 7 and 8 AM.  He has significant nocturia about 4-7 times per average night.  He is on 2 different muscle relaxers, on oxycodone as needed, he takes Seroquel at bedtime, about 25 to 50 mg on average.  He drinks alcohol  very occasionally.  He does not currently drive.  He is on testosterone  replacement and also takes Cymbalta.  He has been on modafinil 200 mg once daily in the morning, he has been on it for years, approximately 10 years per wife's report.  He would be willing to get reevaluated for his sleep apnea and get back on PAP therapy.  His snoring is mild and intermittent per wife's report.  Epworth sleepiness score is 0 out of 24, fatigue severity score is 14 out of 63.   His Past Medical History Is Significant For: Past Medical History:  Diagnosis Date   Allergic rhinitis    BPH (benign prostatic hyperplasia)    Chronic back pain    GERD (gastroesophageal reflux disease)    Hypertension  Hypogonadism male    Malignant neoplasm of prostate (HCC)    OSA (obstructive sleep apnea)    Scoliosis     His Past Surgical History Is Significant For: Past Surgical History:  Procedure Laterality Date   CATARACT EXTRACTION     EYE SURGERY     FOREARM SURGERY     greenfield filter     HERNIA REPAIR     right arm muscle repair     ROTATOR CUFF REPAIR     TONSILLECTOMY      His Family History Is Significant For: Family History  Problem Relation Age of Onset   Heart murmur Mother    Hypertension Mother    Heart disease Mother    Pancreatic cancer Father    Melanoma Father     His Social History Is Significant For: Social History   Socioeconomic History    Marital status: Married    Spouse name: Not on file   Number of children: Not on file   Years of education: Not on file   Highest education level: Not on file  Occupational History   Not on file  Tobacco Use   Smoking status: Never    Passive exposure: Never   Smokeless tobacco: Never  Substance and Sexual Activity   Alcohol  use: Not on file   Drug use: Not on file   Sexual activity: Not on file  Other Topics Concern   Not on file  Social History Narrative   2-3 cups of caffeine in the morning    Social Drivers of Health   Financial Resource Strain: Not on file  Food Insecurity: Not on file  Transportation Needs: Not on file  Physical Activity: Not on file  Stress: Not on file  Social Connections: Not on file    His Allergies Are:  No Known Allergies:   His Current Medications Are:  Outpatient Encounter Medications as of 05/29/2024  Medication Sig   cetirizine (ZYRTEC) 5 MG tablet Take 5 mg by mouth daily.   cloNIDine (CATAPRES - DOSED IN MG/24 HR) 0.1 mg/24hr patch Place 0.1 mg onto the skin once a week.   DULoxetine (CYMBALTA) 60 MG capsule Take 1 capsule by mouth daily.   Eszopiclone 3 MG TABS Take 3 mg by mouth at bedtime.   lidocaine (LIDODERM) 5 % Place 1 patch onto the skin daily.   lisinopril (ZESTRIL) 10 MG tablet Take 10 mg by mouth daily.   loratadine (CLARITIN) 10 MG tablet Take 10 mg by mouth daily.   melatonin 1 MG TABS tablet Take 1 mg by mouth at bedtime.   methocarbamol (ROBAXIN) 750 MG tablet Take 750 mg by mouth every 8 (eight) hours as needed for muscle spasms.   modafinil (PROVIGIL) 200 MG tablet Take 200 mg by mouth daily.   montelukast (SINGULAIR) 10 MG tablet Take 10 mg by mouth at bedtime.   Multiple Vitamin (MULTI-VITAMIN) tablet Take 1 tablet by mouth daily.   naloxone (NARCAN) nasal spray 4 mg/0.1 mL Place 1 spray into the nose once.   Oxycodone HCl 10 MG TABS Take 10-20 mg by mouth every 6 (six) hours as needed (pain).   pantoprazole  (PROTONIX) 40 MG tablet Take 40 mg by mouth daily.   predniSONE (DELTASONE) 10 MG tablet Take 10 mg by mouth daily with breakfast.   QUEtiapine (SEROQUEL) 25 MG tablet Take 50-75 mg by mouth at bedtime.   SUMAtriptan Succinate Refill (IMITREX STATDOSE REFILL) 4 MG/0.5ML SOCT Inject 1 Insert into the skin  as needed.   tadalafil (CIALIS) 10 MG tablet Take 10 mg by mouth daily as needed for erectile dysfunction.   tamsulosin (FLOMAX) 0.4 MG CAPS capsule Take 0.4 mg by mouth daily after supper.   Testosterone  20.25 MG/1.25GM (1.62%) GEL Place 1 Dose onto the skin daily at 2 PM.   tiZANidine (ZANAFLEX) 4 MG capsule Take 4 mg by mouth 3 (three) times daily as needed for muscle spasms.   albuterol (PROVENTIL) (2.5 MG/3ML) 0.083% nebulizer solution Take 2.5 mg by nebulization every 6 (six) hours as needed for wheezing or shortness of breath. (Patient not taking: Reported on 05/29/2024)   oxymorphone (OPANA ER) 20 MG 12 hr tablet Take 20 mg by mouth every 12 (twelve) hours. (Patient not taking: Reported on 05/29/2024)   sodium fluoride (DENTA 5000 PLUS) 1.1 % CREA dental cream Place 1 Application onto teeth at bedtime. (Patient not taking: Reported on 05/29/2024)   No facility-administered encounter medications on file as of 05/29/2024.  :   Review of Systems:  Out of a complete 14 point review of systems, all are reviewed and negative with the exception of these symptoms as listed below:  Review of Systems  Objective:  Neurological Exam  Physical Exam Physical Examination:   Vitals:   05/29/24 1112  BP: 124/78  Pulse: 60  SpO2: 98%    General Examination: The patient is a very pleasant 68 y.o. male in no acute distress. He appears deconditioned, adequately groomed.   HEENT: Normocephalic, atraumatic, pupils are equal, round and reactive to light, tracking is preserved, corrective eyeglasses in place.  Hearing is mildly impaired, bilateral hearing aids in place.  Speech without dysarthria  or hypophonia.  Face is symmetric with normal facial animation. Neck mobility is reduced, no carotid bruits.  Airway examination reveals mild to moderate mouth dryness, adequate dental hygiene, moderate airway crowding secondary to thicker tongue, small airway entry.  Tonsils absent.  Mallampati class III.  Neck circumference 14-5/8 inches.   Chest: Clear to auscultation without wheezing, rhonchi or crackles noted.  Heart: S1+S2+0, regular and normal without murmurs, rubs or gallops noted.   Abdomen: Soft, non-tender and non-distended.  Extremities: There is no pitting edema in the distal lower extremities bilaterally.   Skin: Warm and dry without trophic changes noted.   Musculoskeletal: exam reveals scoliosis and unequal shoulder height.  He reports low back pain.   Neurologically:  Mental status: The patient is awake, alert and pays attention, he is unable to provide details of his history, history is heavily supplemented by his wife.   Mood is constricted and affect is constricted.  Cranial nerves II - XII are as described above under HEENT exam.  Motor exam: Normal bulk, moving all 4 extremities.   There is no obvious action or resting tremor.  Fine motor skills and coordination: Intact grossly.  Cerebellar testing: No dysmetria or intention tremor. There is no truncal or gait ataxia.  Sensory exam: intact to light touch in the upper and lower extremities.  Gait, station and balance: He stands without difficulty and walks without a walking aid.   Assessment and Plan:  In summary, Benjamin Elliott is a 68 y.o.-year old male with an underlying complex medical history of BPH, prostate cancer, history of DVT and PE, status post Greenfield filter, status post spinal cord stimulator, hearing loss with status post bilateral hearing aids, allergic rhinitis, reflux disease, hypogonadism, scoliosis, chronic pain, on chronic narcotic pain medication, hypertension, hyperlipidemia, and depression,  who presents for evaluation  of his obstructive sleep apnea.  He was diagnosed with severe obstructive sleep apnea and also had central apneas back in 2014 and was originally on BiPAP ST but then got a CPAP machine a few years later.  He is no longer on PAP therapy for at least 2 years as he lost his machine.  He has had cognitive complaints.  In fact, he is scheduled for an evaluation for his memory loss next week.  He has significant sleep disruption.  He is on high risk medications and multiple different psychotropic medications, continues to take potentially sedating medications in the form of 2 different muscle relaxers, chronic narcotic pain medication, Seroquel at night, Cymbalta for depression, and also takes modafinil during the day.   We will pick up this discussion next week.  He is advised to proceed with a sleep study to reevaluate his sleep disordered breathing.  He would be willing to get restarted on PAP therapy.   We will plan a follow-up after testing.  I answered all their questions today and the patient and his wife were in agreement.   This was an extended visit of over 60 minutes with copious record review involved including paper records as well as electronic records.  His wife will try to get records from [facility] neurology as well.    In considerable counseling and coordination of care, comprehensive Thank you very much for allowing me to participate in the care of this nice patient. If I can be of any further assistance to you please do not hesitate to call me at [phone].  Sincerely,   True Mar, MD, PhD

## 2024-05-29 NOTE — Patient Instructions (Signed)
 Thank you for choosing [facility] for your sleep related care! It was nice to meet you today!   Here is what we discussed today:    Based on your symptoms and your exam I believe you may still be at risk for obstructive sleep apnea (aka OSA). We should proceed with a sleep study to determine whether you do or do not have OSA and how severe it is. Even, if you have mild OSA, I may want you to consider treatment with CPAP, as treatment of even borderline or mild sleep apnea can result and improvement of symptoms such as sleep disruption, daytime sleepiness, nighttime bathroom breaks, restless leg symptoms, improvement of headache syndromes, even improved mood disorder.   As explained, an attended sleep study (meaning you get to stay overnight in the sleep lab), lets [place] monitor sleep-related behaviors such as sleep talking and leg movements in sleep, in addition to monitoring for sleep apnea.  A home sleep test is a screening tool for sleep apnea diagnosis only, but unfortunately, does not help with any other sleep-related diagnoses.  Please remember, the long-term risks and ramifications of untreated moderate to severe obstructive sleep apnea may include (but are not limited to): increased risk for cardiovascular disease, including congestive heart failure, stroke, difficult to control hypertension, treatment resistant obesity, arrhythmias, especially irregular heartbeat commonly known as A. Fib. (atrial fibrillation); even type 2 diabetes has been linked to untreated OSA.   Other correlations that untreated obstructive sleep apnea include macular edema which is swelling of the retina in the eyes, droopy eyelid syndrome, and elevated hemoglobin and hematocrit levels (often referred to as polycythemia).  Sleep apnea can cause disruption of sleep and sleep deprivation in most cases, which, in turn, can cause recurrent headaches, problems with memory, mood, concentration, focus, and  vigilance. Most people with untreated sleep apnea report excessive daytime sleepiness, which can affect their ability to drive. Please do not drive or use heavy equipment or machinery, if you feel sleepy! Patients with sleep apnea can also develop difficulty initiating and maintaining sleep (aka insomnia).   Having sleep apnea may increase your risk for other sleep disorders, including involuntary behaviors sleep such as sleep terrors, sleep talking, sleepwalking.    Having sleep apnea can also increase your risk for restless leg syndrome and leg movements at night.   Please note that untreated obstructive sleep apnea may carry additional perioperative morbidity. Patients with significant obstructive sleep apnea (typically, in the moderate to severe degree) should receive, if possible, perioperative PAP (positive airway pressure) therapy and the surgeons and particularly the anesthesiologists should be informed of the diagnosis and the severity of the sleep disordered breathing.   We will call you or email you through MyChart with regards to your test results and plan a follow-up in sleep clinic accordingly. Most likely, you will hear from one of our nurses.   Our sleep lab administrative assistant will call you to schedule your sleep study and give you further instructions, regarding the check in process for the sleep study, arrival time, what to bring, when you can expect to leave after the study, etc., and to answer any other logistical questions you may have. If you don't hear back from her by about 2 weeks from now, please feel free to call her direct line at [phone] or you can call our general clinic number, or email [place] through My Chart.

## 2024-06-02 ENCOUNTER — Ambulatory Visit: Admitting: Neurology

## 2024-06-20 ENCOUNTER — Ambulatory Visit: Admitting: Neurology

## 2024-06-25 ENCOUNTER — Telehealth: Payer: Self-pay | Admitting: Neurology

## 2024-06-25 NOTE — Telephone Encounter (Signed)
 LVM and sent text msg informing pt of need to reschedule 06/27/24 appt - MD out

## 2024-06-27 ENCOUNTER — Ambulatory Visit: Admitting: Neurology

## 2024-07-01 ENCOUNTER — Telehealth: Payer: Self-pay | Admitting: Neurology

## 2024-07-01 DIAGNOSIS — C61 Malignant neoplasm of prostate: Secondary | ICD-10-CM | POA: Diagnosis not present

## 2024-07-01 DIAGNOSIS — E291 Testicular hypofunction: Secondary | ICD-10-CM | POA: Diagnosis not present

## 2024-07-01 NOTE — Telephone Encounter (Signed)
 [employer] no auth req via website   Patient is scheduled at [facility] for 07/27/2024 at 8 pm.  Mailed packet

## 2024-07-15 ENCOUNTER — Encounter: Payer: Self-pay | Admitting: Physician Assistant

## 2024-07-27 ENCOUNTER — Ambulatory Visit: Admitting: Neurology

## 2024-07-27 DIAGNOSIS — G4731 Primary central sleep apnea: Secondary | ICD-10-CM

## 2024-07-27 DIAGNOSIS — R351 Nocturia: Secondary | ICD-10-CM

## 2024-07-27 DIAGNOSIS — R9431 Abnormal electrocardiogram [ECG] [EKG]: Secondary | ICD-10-CM

## 2024-07-27 DIAGNOSIS — Z79899 Other long term (current) drug therapy: Secondary | ICD-10-CM

## 2024-07-27 DIAGNOSIS — G4733 Obstructive sleep apnea (adult) (pediatric): Secondary | ICD-10-CM

## 2024-07-27 DIAGNOSIS — G4720 Circadian rhythm sleep disorder, unspecified type: Secondary | ICD-10-CM

## 2024-07-27 DIAGNOSIS — R0683 Snoring: Secondary | ICD-10-CM

## 2024-07-28 NOTE — Procedures (Unsigned)
 Physician Interpretation: Please see link under Procedure Tab or under Encounters tab for physician report, technical report, as well as O2 titration and/or PAP titration tables (if applicable).

## 2024-07-31 ENCOUNTER — Ambulatory Visit: Payer: Self-pay | Admitting: Neurology

## 2024-09-12 ENCOUNTER — Encounter: Payer: Self-pay | Admitting: Physician Assistant
